# Patient Record
Sex: Female | Born: 1995 | Hispanic: Yes | Marital: Single | Smoking: Never smoker
Health system: Southern US, Community
[De-identification: ages and names within clinical notes are randomized; demographics above are authoritative.]

## PROBLEM LIST (undated history)

## (undated) ENCOUNTER — Inpatient Hospital Stay: Payer: Self-pay

## (undated) DIAGNOSIS — Z789 Other specified health status: Secondary | ICD-10-CM

## (undated) HISTORY — PX: NO PAST SURGERIES: SHX2092

---
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
## 2016-01-02 ENCOUNTER — Encounter: Payer: Self-pay | Admitting: *Deleted

## 2016-01-02 ENCOUNTER — Observation Stay
Admission: EM | Admit: 2016-01-02 | Discharge: 2016-01-02 | Disposition: A | Discharge status: Home / Self Care | Payer: No Typology Code available for payment source | Attending: Obstetrics and Gynecology | Admitting: Obstetrics and Gynecology

## 2016-01-02 ENCOUNTER — Emergency Department
Admission: EM | Admit: 2016-01-02 | Discharge: 2016-01-02 | Disposition: A | Discharge status: Home / Self Care | Payer: No Typology Code available for payment source | Attending: Obstetrics and Gynecology | Admitting: Obstetrics and Gynecology

## 2016-01-02 DIAGNOSIS — S3991XA Unspecified injury of abdomen, initial encounter: Secondary | ICD-10-CM | POA: Insufficient documentation

## 2016-01-02 DIAGNOSIS — Y9389 Activity, other specified: Secondary | ICD-10-CM | POA: Insufficient documentation

## 2016-01-02 DIAGNOSIS — O9A213 Injury, poisoning and certain other consequences of external causes complicating pregnancy, third trimester: Secondary | ICD-10-CM | POA: Insufficient documentation

## 2016-01-02 DIAGNOSIS — Y998 Other external cause status: Secondary | ICD-10-CM | POA: Insufficient documentation

## 2016-01-02 DIAGNOSIS — Z3A37 37 weeks gestation of pregnancy: Secondary | ICD-10-CM | POA: Insufficient documentation

## 2016-01-02 DIAGNOSIS — Y92410 Unspecified street and highway as the place of occurrence of the external cause: Secondary | ICD-10-CM | POA: Insufficient documentation
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
## 2016-01-02 NOTE — OB Triage Note (Signed)
FHT reactive, reassuring, occasional mild ctx and irritability noted, pt denies any abd or back pain. Confirms she feels fine to go home.
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
## 2016-01-04 LAB — OB RESULTS CONSOLE GBS: STREP GROUP B AG: POSITIVE

## 2016-01-17 ENCOUNTER — Encounter: Payer: Self-pay | Admitting: *Deleted

## 2016-01-17 ENCOUNTER — Observation Stay
Admission: EM | Admit: 2016-01-17 | Discharge: 2016-01-17 | Disposition: A | Discharge status: Home / Self Care | Payer: Medicaid Other | Attending: Obstetrics and Gynecology | Admitting: Obstetrics and Gynecology

## 2016-01-17 ENCOUNTER — Inpatient Hospital Stay
Admission: EM | Admit: 2016-01-17 | Discharge: 2016-01-17 | Disposition: A | Discharge status: Home / Self Care | Payer: Medicaid Other | Source: Home / Self Care | Admitting: Obstetrics and Gynecology

## 2016-01-17 DIAGNOSIS — O471 False labor at or after 37 completed weeks of gestation: Principal | ICD-10-CM | POA: Insufficient documentation

## 2016-01-17 DIAGNOSIS — Z3A40 40 weeks gestation of pregnancy: Secondary | ICD-10-CM

## 2016-01-17 DIAGNOSIS — Z3A39 39 weeks gestation of pregnancy: Secondary | ICD-10-CM | POA: Diagnosis not present

## 2016-01-17 DIAGNOSIS — O479 False labor, unspecified: Secondary | ICD-10-CM | POA: Diagnosis present

## 2016-01-17 HISTORY — DX: Other specified health status: Z78.9

## 2016-01-17 MED ORDER — ZOLPIDEM TARTRATE 5 MG PO TABS
5.0000 mg | ORAL_TABLET | Freq: Once | ORAL | Status: AC
  Administered 2016-01-17: 5 mg via ORAL
  Filled 2016-01-17: qty 1

## 2016-01-17 MED ORDER — ACETAMINOPHEN 325 MG PO TABS: 650.0000 mg | ORAL_TABLET | ORAL | Status: DC | PRN

## 2016-01-17 MED ORDER — CALCIUM CARBONATE ANTACID 500 MG PO CHEW: 2.0000 | CHEWABLE_TABLET | ORAL | Status: DC | PRN

## 2016-01-17 NOTE — OB Triage Note (Signed)
Patient c/o contractions q5-7 minutes since 1400. Rates pain 7/10.

## 2016-01-17 NOTE — Progress Notes (Signed)
Dr Dalbert GarnetBeasley given update- aware pt resting/ sleeping at times. Aware pt reports abd tightenings assoc with some lower backache at times. No further Af viewed on perineum, bed pads or on glove- nitazine neg.  Dr Dalbert GarnetBeasley informed of several absent prenatal lab values, present ones- GBS (positive), CBC, GC/C, glucose- only ones available from [facility]. Dr Dalbert GarnetBeasley instructing pt to keep her regular appointment this week with clinic, and inquire where her prenatal panel labs would be found or [facility]. Also requesting pt to return to clinic or [facility] if further vaginal leakage occurs.

## 2016-01-17 NOTE — OB Triage Note (Signed)
Patient discharged home ambulatory and in stable condition.

## 2016-01-17 NOTE — OB Triage Provider Note (Signed)
TRIAGE VISIT with NST   Kaitlin Marquez is a 20 y.o. G1P0. She is at 56110w5d gestation, presenting with signs of labor and ROM.  Indication: Contractions  S: Resting comfortably. Mild-palpating CTX, no VB. Active fetal movement. Concerned about leaking clear fluid, though did not need pad and perineum was dry on presentation. O:  BP 105/53 mmHg  Pulse 82  Temp(Src) 97.9 F (36.6 C) (Oral)  Resp 18  Ht 5' (1.524 m)  Wt 67.132 kg (148 lb)  BMI 28.90 kg/m2  LMP 04/24/2015 No results found for this or any previous visit (from the past 48 hour(s)).  Abd: FNTTP      Ext: Non-tender, Nonedmeatous    FHT: Reactive strip with mod var, +accels no decels TOCO: quiet [id number]:Dilation: Fingertip Effacement (%): Thick Cervical Position: Posterior Station: Ballotable Presentation: Vertex Exam by:: Kaitlin FishermanM Taylor RN  NST: Reactive. See FHT above for particulars.  A/P:  20 y.o. G1P0 54110w5d with contractions and leaking fluid.   Labor: not present.   R/o ROM: SSE negative x 3. No fluid noted after two hours of monitoring and cervix remained closed.  Fetal Wellbeing: Reassuring Cat 1 tracing.  D/c home stable, precautions reviewed, follow-up as scheduled.

## 2016-01-17 NOTE — Progress Notes (Signed)
Dr Dalbert GarnetBeasley called [facility], requested pt to be observed for 2 hours after v/e and re examined.

## 2016-01-17 NOTE — Progress Notes (Signed)
Dr Dalbert GarnetBeasley given report, states since nitrazine neg and no AF viewed or reported since pt arrival, RN to do v/e. Pt may be d/c'ed home if cervix not demonstrating changes associated with  labor and pt comfortable with no further leakage of fluid.

## 2016-01-17 NOTE — OB Triage Note (Signed)
Pt reports awaking at midnight to find her pj groin wet with clear fluid. Has continued to leak small amounts clear fluid. Reports constant lower back pain - starting approx 20 min after she awoke

## 2016-01-17 NOTE — Progress Notes (Signed)
Pt left walking with belongings and fob. Received d/c and Dr Quincy SheehanBeasely's  Instructions, reviewed with RN. Pt voicing understanding.

## 2016-01-17 NOTE — OB Triage Note (Signed)
See Progress note

## 2016-01-18 ENCOUNTER — Inpatient Hospital Stay
Admission: EM | Admit: 2016-01-18 | Discharge: 2016-01-21 | DRG: 775 | Disposition: A | Discharge status: Home / Self Care | Payer: Medicaid Other | Attending: Obstetrics and Gynecology | Admitting: Obstetrics and Gynecology

## 2016-01-18 ENCOUNTER — Inpatient Hospital Stay
Admission: EM | Admit: 2016-01-18 | Discharge: 2016-01-18 | Disposition: A | Discharge status: Home / Self Care | Payer: Medicaid Other | Attending: Obstetrics and Gynecology | Admitting: Obstetrics and Gynecology

## 2016-01-18 ENCOUNTER — Encounter: Payer: Self-pay | Admitting: *Deleted

## 2016-01-18 DIAGNOSIS — Z3A38 38 weeks gestation of pregnancy: Secondary | ICD-10-CM

## 2016-01-18 DIAGNOSIS — Z3A40 40 weeks gestation of pregnancy: Secondary | ICD-10-CM | POA: Insufficient documentation

## 2016-01-18 DIAGNOSIS — O99824 Streptococcus B carrier state complicating childbirth: Secondary | ICD-10-CM | POA: Diagnosis present

## 2016-01-18 DIAGNOSIS — O99820 Streptococcus B carrier state complicating pregnancy: Secondary | ICD-10-CM | POA: Diagnosis present

## 2016-01-18 LAB — CHLAMYDIA/NGC RT PCR (ARMC ONLY)
CHLAMYDIA TR: NOT DETECTED
N GONORRHOEAE: NOT DETECTED

## 2016-01-18 LAB — CBC
HEMATOCRIT: 35.8 % (ref 35.0–47.0)
Hemoglobin: 12 g/dL (ref 12.0–16.0)
MCH: 29 pg (ref 26.0–34.0)
MCHC: 33.6 g/dL (ref 32.0–36.0)
MCV: 86.3 fL (ref 80.0–100.0)
Platelets: 230 10*3/uL (ref 150–440)
RBC: 4.15 MIL/uL (ref 3.80–5.20)
RDW: 16.3 % — AB (ref 11.5–14.5)
WBC: 9.5 10*3/uL (ref 3.6–11.0)

## 2016-01-18 LAB — TYPE AND SCREEN
ABO/RH(D): O POS
ANTIBODY SCREEN: NEGATIVE

## 2016-01-18 MED ORDER — ZOLPIDEM TARTRATE 5 MG PO TABS: 5.0000 mg | ORAL_TABLET | Freq: Every evening | ORAL | Status: DC | PRN

## 2016-01-18 MED ORDER — BUTORPHANOL TARTRATE 1 MG/ML IJ SOLN: 2.0000 mg | INTRAMUSCULAR | Status: DC | PRN

## 2016-01-18 MED ORDER — LIDOCAINE HCL (PF) 1 % IJ SOLN
INTRAMUSCULAR | Status: AC
  Filled 2016-01-18: qty 30

## 2016-01-18 MED ORDER — OXYTOCIN 10 UNIT/ML IJ SOLN
INTRAMUSCULAR | Status: AC
  Filled 2016-01-18: qty 2

## 2016-01-18 MED ORDER — SODIUM CHLORIDE 0.9 % IV SOLN: 2.0000 g | Freq: Once | INTRAVENOUS | Status: DC

## 2016-01-18 MED ORDER — LACTATED RINGERS IV SOLN
INTRAVENOUS | Status: DC
  Administered 2016-01-18 – 2016-01-19 (×2): via INTRAVENOUS

## 2016-01-18 MED ORDER — OXYTOCIN 40 UNITS IN LACTATED RINGERS INFUSION - SIMPLE MED
2.5000 [IU]/h | INTRAVENOUS | Status: DC
  Filled 2016-01-18: qty 1000

## 2016-01-18 MED ORDER — SODIUM CHLORIDE 0.9 % IV SOLN
2.0000 g | Freq: Once | INTRAVENOUS | Status: AC
  Administered 2016-01-18: 2 g via INTRAVENOUS

## 2016-01-18 MED ORDER — ACETAMINOPHEN 325 MG PO TABS: 650.0000 mg | ORAL_TABLET | ORAL | Status: DC | PRN

## 2016-01-18 MED ORDER — TERBUTALINE SULFATE 1 MG/ML IJ SOLN: 0.2500 mg | Freq: Once | INTRAMUSCULAR | Status: DC | PRN

## 2016-01-18 MED ORDER — AMMONIA AROMATIC IN INHA
RESPIRATORY_TRACT | Status: DC
Start: 2016-01-18 — End: 2016-01-19
  Filled 2016-01-18: qty 10

## 2016-01-18 MED ORDER — CITRIC ACID-SODIUM CITRATE 334-500 MG/5ML PO SOLN
30.0000 mL | ORAL | Status: DC | PRN
Start: 2016-01-18 — End: 2016-01-19

## 2016-01-18 MED ORDER — AMPICILLIN SODIUM 1 G IJ SOLR: 1.0000 g | Freq: Four times a day (QID) | INTRAMUSCULAR | Status: DC

## 2016-01-18 MED ORDER — PRENATAL MULTIVITAMIN CH
1.0000 | ORAL_TABLET | Freq: Every day | ORAL | Status: DC
  Administered 2016-01-19: 1 via ORAL
  Filled 2016-01-18 (×3): qty 1

## 2016-01-18 MED ORDER — AMPICILLIN SODIUM 1 G IJ SOLR
1.0000 g | Freq: Four times a day (QID) | INTRAMUSCULAR | Status: DC
  Administered 2016-01-18: 1 g via INTRAVENOUS
  Filled 2016-01-18 (×4): qty 1000

## 2016-01-18 MED ORDER — ONDANSETRON HCL 4 MG/2ML IJ SOLN: 4.0000 mg | Freq: Four times a day (QID) | INTRAMUSCULAR | Status: DC | PRN

## 2016-01-18 MED ORDER — LACTATED RINGERS IV SOLN: 500.0000 mL | INTRAVENOUS | Status: DC | PRN

## 2016-01-18 MED ORDER — SODIUM CHLORIDE 0.9 % IV SOLN
INTRAVENOUS | Status: AC
  Filled 2016-01-18: qty 2000

## 2016-01-18 MED ORDER — CALCIUM CARBONATE ANTACID 500 MG PO CHEW: 2.0000 | CHEWABLE_TABLET | ORAL | Status: DC | PRN

## 2016-01-18 MED ORDER — OXYTOCIN BOLUS FROM INFUSION: 500.0000 mL | INTRAVENOUS | Status: DC

## 2016-01-18 MED ORDER — DOCUSATE SODIUM 100 MG PO CAPS
100.0000 mg | ORAL_CAPSULE | Freq: Every day | ORAL | Status: DC
  Administered 2016-01-19: 100 mg via ORAL
  Filled 2016-01-18: qty 1

## 2016-01-18 MED ORDER — OXYTOCIN 40 UNITS IN LACTATED RINGERS INFUSION - SIMPLE MED
1.0000 m[IU]/min | INTRAVENOUS | Status: DC
Start: 2016-01-18 — End: 2016-01-19
  Administered 2016-01-18: 1 m[IU]/min via INTRAVENOUS

## 2016-01-18 MED ORDER — MISOPROSTOL 200 MCG PO TABS
ORAL_TABLET | ORAL | Status: AC
  Filled 2016-01-18: qty 4

## 2016-01-18 NOTE — Progress Notes (Signed)
Patient ID: [name], female   DOB: Sep 01, 1996, 20 y.o.   MRN: [id number] Ctx q 4-5 minutes . Reassuring fetal monitoring . Will start Pitocin

## 2016-01-18 NOTE — Progress Notes (Signed)
Dr schermerhorn notified of cervical dilation.

## 2016-01-18 NOTE — Progress Notes (Signed)
Kaitlin HsuLizbeth Marquez is a 20 y.o. G1P0 at 9446w3d  Subjective: Ctx , no pain meds  Second dose of ABX completed   Objective: BP 116/51 mmHg  Pulse 113  Temp(Src) 98.3 F (36.8 C)      FHT:  FHR: 120 bpm, variability: moderate,  accelerations:  Present,  decelerations:  Absent UC:   irregular, every 3-5 minutes SVE:   Dilation: 6 Effacement (%): 100 Station: -1 Exam by:: dr Aziyah Provencal   AROM : clear fluid  Labs: Lab Results  Component Value Date   WBC 9.5 01/18/2016   HGB 12.0 01/18/2016   HCT 35.8 01/18/2016   MCV 86.3 01/18/2016   PLT 230 01/18/2016    Assessment / Plan: Spontaneous labor, progressing normally  Labor: Progressing normally Preeclampsia:  n/a Fetal Wellbeing:  Category I Pain Control:  Labor support without medications I/D:  n/a Anticipated MOD:  NSVD  Will add pitocin after 1 hour if not in adequate ctx pattern   [name] 01/18/2016, 8:43 PM

## 2016-01-18 NOTE — H&P (Signed)
Kaitlin HsuLizbeth Marquez is a 20 y.o. female 2 38+3 WEEKS presenting for LABOR . History OB History    Gravida Para Term Preterm AB TAB SAB Ectopic Multiple Living   1              No past medical history on file.anemia  No past surgical history on file. Family History: family history is not on file. Social History:  has no tobacco, alcohol, and drug history on file.   Prenatal Transfer Tool  Maternal Diabetes: No Genetic Screening: Normal Maternal Ultrasounds/Referrals: Normal Fetal Ultrasounds or other Referrals:  None Maternal Substance Abuse:  No Significant Maternal Medications:  None Significant Maternal Lab Results:  None, Other Comments:  None  ROS  Dilation: 5.5 Effacement (%): 70 Station: -2 Exam by:: AED There were no vitals taken for this visit. Exam Physical Exam   By tjs  Lungs CTA  CV RRR  cx : 5 cm / c/ 0 vix , BBOW  Prenatal labs: ABO, Rh: --/--/O POS (03/21 1544) Antibody: NEG (03/21 1544) Rubella:   immune RPR:   neg HBsAg:   neg HIV:   neg  GBS: Positive (03/07 0000)   Assessment/Plan: Labor  Reassuring fetal  Monitoring ( cat 1) GBS  Has one dose of ampicillin started at 1615   SCHERMERHORN,THOMAS 01/18/2016, 5:36 PM

## 2016-01-19 LAB — CBC
HCT: 31.7 % — ABNORMAL LOW (ref 35.0–47.0)
Hemoglobin: 10.6 g/dL — ABNORMAL LOW (ref 12.0–16.0)
MCH: 28.8 pg (ref 26.0–34.0)
MCHC: 33.3 g/dL (ref 32.0–36.0)
MCV: 86.3 fL (ref 80.0–100.0)
PLATELETS: 212 10*3/uL (ref 150–440)
RBC: 3.68 MIL/uL — ABNORMAL LOW (ref 3.80–5.20)
RDW: 16.1 % — AB (ref 11.5–14.5)
WBC: 14 10*3/uL — AB (ref 3.6–11.0)

## 2016-01-19 LAB — ABO/RH: ABO/RH(D): O POS

## 2016-01-19 LAB — RPR: RPR: NONREACTIVE

## 2016-01-19 MED ORDER — LANOLIN HYDROUS EX OINT: TOPICAL_OINTMENT | CUTANEOUS | Status: DC | PRN

## 2016-01-19 MED ORDER — ONDANSETRON HCL 4 MG/2ML IJ SOLN: 4.0000 mg | INTRAMUSCULAR | Status: DC | PRN

## 2016-01-19 MED ORDER — SENNOSIDES-DOCUSATE SODIUM 8.6-50 MG PO TABS
2.0000 | ORAL_TABLET | ORAL | Status: DC
  Administered 2016-01-20: 2 via ORAL
  Filled 2016-01-19: qty 2

## 2016-01-19 MED ORDER — DIPHENHYDRAMINE HCL 25 MG PO CAPS
25.0000 mg | ORAL_CAPSULE | Freq: Four times a day (QID) | ORAL | Status: DC | PRN
  Filled 2016-01-19: qty 1

## 2016-01-19 MED ORDER — ACETAMINOPHEN 325 MG PO TABS: 650.0000 mg | ORAL_TABLET | ORAL | Status: DC | PRN

## 2016-01-19 MED ORDER — MEASLES, MUMPS & RUBELLA VAC ~~LOC~~ INJ: 0.5000 mL | INJECTION | Freq: Once | SUBCUTANEOUS | Status: DC

## 2016-01-19 MED ORDER — FERROUS SULFATE 325 (65 FE) MG PO TABS
325.0000 mg | ORAL_TABLET | Freq: Two times a day (BID) | ORAL | Status: DC
  Administered 2016-01-19 – 2016-01-21 (×5): 325 mg via ORAL
  Filled 2016-01-19 (×5): qty 1

## 2016-01-19 MED ORDER — SIMETHICONE 80 MG PO CHEW: 80.0000 mg | CHEWABLE_TABLET | ORAL | Status: DC | PRN

## 2016-01-19 MED ORDER — ONDANSETRON HCL 4 MG PO TABS: 4.0000 mg | ORAL_TABLET | ORAL | Status: DC | PRN

## 2016-01-19 MED ORDER — MAGNESIUM HYDROXIDE 400 MG/5ML PO SUSP: 30.0000 mL | ORAL | Status: DC | PRN

## 2016-01-19 MED ORDER — DIBUCAINE 1 % RE OINT: 1.0000 "application " | TOPICAL_OINTMENT | RECTAL | Status: DC | PRN

## 2016-01-19 MED ORDER — WITCH HAZEL-GLYCERIN EX PADS: 1.0000 "application " | MEDICATED_PAD | CUTANEOUS | Status: DC | PRN

## 2016-01-19 MED ORDER — IBUPROFEN 600 MG PO TABS
600.0000 mg | ORAL_TABLET | Freq: Four times a day (QID) | ORAL | Status: DC
  Administered 2016-01-19 – 2016-01-21 (×10): 600 mg via ORAL
  Filled 2016-01-19 (×10): qty 1

## 2016-01-19 MED ORDER — BENZOCAINE-MENTHOL 20-0.5 % EX AERO
1.0000 "application " | INHALATION_SPRAY | CUTANEOUS | Status: DC | PRN
  Administered 2016-01-19: 1 via TOPICAL
  Filled 2016-01-19: qty 56

## 2016-01-19 NOTE — Progress Notes (Signed)
Final Diagnosis: IUP at 40 weeks with labor symptoms. Pt triaged and discharged after NST reactive.

## 2016-01-19 NOTE — Progress Notes (Signed)
Report received from [place]. Pt laboring, Pitocin at 1 milliunit, last cervical exam, 8cm, pt refusing IV pain med or Epidural. In room to assume care, pt breathing well with contraction and tolerating labor well, family present in room very supportive. Introduced self to pt and family, reviewed plan and explained s/s that pt should report if noted. Understanding verbalized.

## 2016-01-20 NOTE — Progress Notes (Signed)
PPD #1, SVD, baby girl "[place]"  S:  Reports feeling good             Tolerating po/ No nausea or vomiting             Bleeding is light             Pain controlled withMotrin             Up ad lib / ambulatory / voiding QS  Newborn breast feeding - going well    O:               VS: BP 104/53 mmHg  Pulse 63  Temp(Src) 98.5 F (36.9 C) (Oral)  Resp 16  Ht 5' (1.524 m)  Wt 66.225 kg (146 lb)  BMI 28.51 kg/m2  SpO2 100%   LABS:              Recent Labs  01/18/16 1544 01/19/16 0639  WBC 9.5 14.0*  HGB 12.0 10.6*  PLT 230 212               Blood type: --/--/O POS (03/21 1545)  Rubella:                       I&O: Intake/Output      03/22 0701 - 03/23 0700 03/23 0701 - 03/24 0700   I.V. (mL/kg) 800 (12.1)    Total Intake(mL/kg) 800 (12.1)    Urine (mL/kg/hr) 900 (0.6)    Blood     Total Output 900     Net -100                        Physical Exam:             Alert and oriented X3  Lungs: Clear and unlabored  Heart: regular rate and rhythm / no mumurs  Abdomen: soft, non-tender, non-distended              Fundus: firm, non-tender, U-1  Perineum: slight edema, median episiotomy healing well, no significant erythema   Lochia: light, no clots  Extremities: no edema, no calf pain or tenderness    A: PPD # 1, SVD   Doing well - stable status  ABL Anemia - on Ferrous Sulfate  GBS positive - without adequate abx treatment - baby to be discharged home tomorrow  P: Routine post partum orders  See Lactation today   May shower, ambulate in halls  Contraception: planning Depo shot    Anticipate d/c home tomorrow   Carlean JewsMeredith Deriana Vanderhoef, CNM

## 2016-01-21 MED ORDER — WITCH HAZEL-GLYCERIN EX PADS: 1.0000 "application " | MEDICATED_PAD | CUTANEOUS | Status: DC | PRN

## 2016-01-21 MED ORDER — IBUPROFEN 600 MG PO TABS: 600.0000 mg | ORAL_TABLET | Freq: Four times a day (QID) | ORAL | Status: DC

## 2016-01-21 NOTE — Progress Notes (Signed)
Discharge instructions complete and prescriptions given. Patient verbalizes understanding of teaching. Patient discharged home at 1645.

## 2016-01-21 NOTE — Discharge Summary (Signed)
  Obstetric Discharge Summary   Patient ID: Kaitlin Marquez MRN: [id number] DOB/AGE: 17-Jun-1996 20 y.o.   Date of Admission: 01/18/2016  Date of Discharge:  01/21/16 Admitting Diagnosis: Onset of Labor at 966w4d  Secondary Diagnosis: GBS Positive  Mode of Delivery: normal spontaneous vaginal delivery     Discharge Diagnosis: Postpartum care following vaginal delivery   Intrapartum Procedures: Atificial rupture of membranes, episiotomy Median, GBS prophylaxis and pitocin augmentation   Post partum procedures: none  Complications: second degree ( MLE) degree perineal laceration   Brief Hospital Course  Kaitlin Marquez is a G1P0 who had a SVD on 01/19/16;  for further details of this delivery, please refer to the delivery note.  Patient had an uncomplicated postpartum course.  By time of discharge on PPD#2, her pain was controlled on oral pain medications; she had appropriate lochia and was ambulating, voiding without difficulty and tolerating regular diet.  She was deemed stable for discharge to home.    Labs: CBC Latest Ref Rng 01/19/2016 01/18/2016  WBC 3.6 - 11.0 K/uL 14.0(H) 9.5  Hemoglobin 12.0 - 16.0 g/dL 10.6(L) 12.0  Hematocrit 35.0 - 47.0 % 31.7(L) 35.8  Platelets 150 - 440 K/uL 212 230   O POS  Physical exam:  Blood pressure 103/52, pulse 55, temperature 98.5 F (36.9 C), temperature source Oral, resp. rate 18, height 5' (1.524 m), weight 66.225 kg (146 lb), SpO2 100 %, unknown if currently breastfeeding. General: alert and no distress Lochia: appropriate Abdomen: soft, NT Uterine Fundus: firm MLE not checked Extremities: No evidence of DVT seen on physical exam. No lower extremity edema.  Discharge Instructions: Per After Visit Summary. Activity: Advance as tolerated. Pelvic rest for 6 weeks.  Also refer to After Visit Summary Diet: Regular Medications:   Medication List    Notice    You have not been prescribed any medications.      Outpatient follow up:  Postpartum contraception: Depo-Provera  Discharged Condition: good  Discharged to: home   Newborn Data:  Baby girl  named "Isabella"  Disposition:home with mother  Apgars: APGAR (1 MIN): 7   APGAR (5 MINS): 9   APGAR (10 MINS):    Baby Feeding: Breast  Karena AddisonSigmon, Meredith C, CNM 01/21/2016

## 2016-04-26 ENCOUNTER — Encounter (HOSPITAL_COMMUNITY): Payer: Self-pay

## 2016-11-06 ENCOUNTER — Encounter (HOSPITAL_COMMUNITY): Payer: Self-pay

## 2020-01-05 ENCOUNTER — Ambulatory Visit: Payer: MEDICAID | Attending: Internal Medicine

## 2020-01-05 DIAGNOSIS — Z23 Encounter for immunization: Secondary | ICD-10-CM | POA: Insufficient documentation

## 2020-01-05 NOTE — Progress Notes (Signed)
   Covid-19 Vaccination Clinic  Name:  Hang Ammon    MRN: [id number] DOB: [birth year]  01/05/2020  Ms. Haileigh Pitz was observed post Covid-19 immunization for 15 minutes without incident. She was provided with Vaccine Information Sheet and instruction to access the V-Safe system.   Ms. Dyan Labarbera was instructed to call 911 with any severe reactions post vaccine: Marland Kitchen Difficulty breathing  . Swelling of face and throat  . A fast heartbeat  . A bad rash all over body  . Dizziness and weakness   Immunizations Administered    Name Date Dose VIS Date Route   Pfizer COVID-19 Vaccine 01/05/2020  8:21 AM 0.3 mL 10/10/2019 Intramuscular   Manufacturer: [employer], [employer]   Lot: [id number]   NDC: [address]-2

## 2020-01-27 ENCOUNTER — Ambulatory Visit: Payer: Self-pay

## 2020-02-27 ENCOUNTER — Ambulatory Visit: Payer: MEDICAID | Attending: Internal Medicine

## 2020-02-27 DIAGNOSIS — Z23 Encounter for immunization: Secondary | ICD-10-CM

## 2020-02-27 NOTE — Progress Notes (Signed)
   Covid-19 Vaccination Clinic  Name:  Kaitlin Marquez    MRN: [id number] DOB: 1996-09-06  02/27/2020  Ms. Kaitlin Marquez was observed post Covid-19 immunization for 15 minutes without incident. She was provided with Vaccine Information Sheet and instruction to access the V-Safe system.   Ms. Kaitlin Marquez was instructed to call 911 with any severe reactions post vaccine: Marland Kitchen Difficulty breathing  . Swelling of face and throat  . A fast heartbeat  . A bad rash all over body  . Dizziness and weakness   Immunizations Administered    Name Date Dose VIS Date Route   Pfizer COVID-19 Vaccine 02/27/2020 10:41 AM 0.3 mL 12/24/2018 Intramuscular   Manufacturer: [employer], [employer]   Lot: [id number]   NDC: [address]-7

## 2020-05-03 ENCOUNTER — Ambulatory Visit (INDEPENDENT_AMBULATORY_CARE_PROVIDER_SITE_OTHER)
Admission: RE | Admit: 2020-05-03 | Discharge: 2020-05-03 | Disposition: A | Discharge status: Home / Self Care | Payer: Self-pay | Source: Ambulatory Visit

## 2020-05-03 DIAGNOSIS — K529 Noninfective gastroenteritis and colitis, unspecified: Secondary | ICD-10-CM

## 2020-05-03 DIAGNOSIS — R1010 Upper abdominal pain, unspecified: Secondary | ICD-10-CM

## 2020-05-03 MED ORDER — ONDANSETRON HCL 4 MG PO TABS: 4.0000 mg | ORAL_TABLET | Freq: Four times a day (QID) | ORAL | 0 refills | Status: DC

## 2020-05-03 NOTE — Discharge Instructions (Signed)
Take the antinausea medication as directed.    Keep yourself hydrated with clear liquids, such as water, Gatorade, Pedialyte, Sprite, or ginger ale.    Go to the emergency department if you have worsening symptoms.

## 2020-05-03 NOTE — ED Provider Notes (Signed)
Virtual Visit via Video Note:  Kaitlin Marquez  initiated request for Telemedicine visit with [facility] team. I connected with Kaitlin Marquez  on 05/03/2020 at 10:36 AM  for a synchronized telemedicine visit using a video enabled HIPPA compliant telemedicine application. I verified that I am speaking with Kaitlin Marquez  using two identifiers. Kaitlin Bail, NP  was physically located in a [facility] Urgent care site and Kaitlin Marquez was located at a different location.   The limitations of evaluation and management by telemedicine as well as the availability of in-person appointments were discussed. Patient was informed that she  may incur a bill ( including co-pay) for this virtual visit encounter. Kaitlin Marquez  expressed understanding and gave verbal consent to proceed with virtual visit.     History of Present Illness:Kaitlin Marquez  is a 24 y.o. female presents for evaluation of vomiting x 3 episodes since last night.  She also reports upper abdominal pain and diarrhea today.  She denies fever, chills, cough, SOB, rash, or other symptoms.  Last BM 30 min PTA.  Treatment at home with Tylenol.  She denies current pregnancy or breastfeeding.      No Known Allergies   Past Medical History:  Diagnosis Date  . Medical history non-contributory      Social History   Tobacco Use  . Smoking status: Never Smoker  Substance Use Topics  . Alcohol use: No  . Drug use: No   ROS: as stated in HPI.  All other systems reviewed and negative.     Observations/Objective: Physical Exam  VITALS: Patient denies fever. GENERAL: Alert, appears well and in no acute distress. HEENT: Atraumatic. Oral mucosa appears moist. NECK: Normal movements of the head and neck. CARDIOPULMONARY: No increased WOB. Speaking in clear sentences. I:E ratio WNL.  MS: Moves all visible extremities without noticeable abnormality. PSYCH: Pleasant and  cooperative, well-groomed. Speech normal rate and rhythm. Affect is appropriate. Insight and judgement are appropriate. Attention is focused, linear, and appropriate.  NEURO: CN grossly intact. Oriented as arrived to appointment on time with no prompting. Moves both UE equally.  SKIN: No obvious lesions, wounds, erythema, or cyanosis noted on face or hands.   Assessment and Plan:    ICD-10-CM   1. Gastroenteritis  K52.9   2. Pain of upper abdomen  R10.10        Follow Up Instructions: Treating with Zofran as needed for nausea and vomiting.  Work note provided for today.  Instructed patient to come to the urgent care or go to the ED if she has worsening symptoms.  Patient agrees to plan of care.      I discussed the assessment and treatment plan with the patient. The patient was provided an opportunity to ask questions and all were answered. The patient agreed with the plan and demonstrated an understanding of the instructions.   The patient was advised to call back or seek an in-person evaluation if the symptoms worsen or if the condition fails to improve as anticipated.      Kaitlin Bail, NP  05/03/2020 10:36 AM         Kaitlin Bail, NP 05/03/20 1036

## 2022-08-03 ENCOUNTER — Other Ambulatory Visit: Payer: Self-pay | Admitting: Internal Medicine

## 2022-08-03 DIAGNOSIS — R1084 Generalized abdominal pain: Secondary | ICD-10-CM

## 2022-09-07 ENCOUNTER — Other Ambulatory Visit: Payer: Self-pay

## 2023-09-24 LAB — OB RESULTS CONSOLE VARICELLA ZOSTER ANTIBODY, IGG: Varicella: NON-IMMUNE/NOT IMMUNE

## 2023-09-24 LAB — OB RESULTS CONSOLE HEPATITIS B SURFACE ANTIGEN: Hepatitis B Surface Ag: NEGATIVE

## 2023-09-24 LAB — OB RESULTS CONSOLE RPR: RPR: NONREACTIVE

## 2023-10-31 NOTE — L&D Delivery Note (Signed)
 Delivery Note  Date of delivery: 05/08/2024 Estimated Date of Delivery: 05/08/24 Patient's last menstrual period was 08/02/2023. EGA: [redacted]w[redacted]d  Delivery Note At 5:16 AM a viable female was delivered via Vaginal, Spontaneous (Presentation: Left Occiput Anterior).  APGAR: pending; weight pending.  Placenta status: Spontaneous, Intact.  Cord: 3 vessels with the following complications: None.  Cord pH: n/a  First Stage: Labor onset: unknown Augmentation : AROM Analgesia /Anesthesia intrapartum: none AROM at 0424  Kaitlin Marquez presented to L&D with active labor. She was augmented with AROM. Fentanyl  given for pain relief.   Second Stage: Complete dilation at 0502 Onset of pushing at 0510 FHR second stage Cat I Delivery at 0516 on 05/08/2024  She progressed to complete and had a spontaneous vaginal birth of a live female over an intact perineum. The fetal head was delivered in OA position with restitution to LOA. Tight double nuchal cord resolved with summersault maneuver. Anterior then posterior shoulders delivered spontaneously with minimal assistance. Baby placed on mom's abdomen and attended to by transition RN, however the cord clamped and cut immediately by FOB to take the baby to the warmer for resuscitation and suction. Cord blood obtained for newborn labs.  Third Stage: Placenta delivered intact with 3VC at 0520 Placenta disposition: routine disposal  Uterine tone firm / bleeding min IV pitocin  given for hemorrhage prophylaxis  Anesthesia: None Episiotomy: None Lacerations: None Suture Repair: n/a Est. Blood Loss (mL):  100  Complications: none  Mom to postpartum.  Baby to Couplet care / Skin to Skin.  Newborn: Birth Weight: pending  Apgar Scores: pending Feeding planned: Kaitlin Marquez, CNM 05/08/2024 5:34 AM

## 2024-03-03 LAB — OB RESULTS CONSOLE HIV ANTIBODY (ROUTINE TESTING): HIV: NONREACTIVE

## 2024-03-03 LAB — OB RESULTS CONSOLE GBS: GBS: NEGATIVE

## 2024-04-10 LAB — OB RESULTS CONSOLE GC/CHLAMYDIA
Chlamydia: NEGATIVE
Neisseria Gonorrhea: NEGATIVE

## 2024-04-10 LAB — OB RESULTS CONSOLE RUBELLA ANTIBODY, IGM: Rubella: IMMUNE

## 2024-05-07 ENCOUNTER — Encounter: Payer: Self-pay | Admitting: Obstetrics and Gynecology

## 2024-05-07 ENCOUNTER — Observation Stay
Admission: EM | Admit: 2024-05-07 | Discharge: 2024-05-07 | Disposition: A | Discharge status: Home / Self Care | Payer: MEDICAID | Attending: Obstetrics and Gynecology | Admitting: Obstetrics and Gynecology

## 2024-05-07 ENCOUNTER — Inpatient Hospital Stay
Admission: EM | Admit: 2024-05-07 | Discharge: 2024-05-09 | DRG: 807 | Disposition: A | Discharge status: Home / Self Care | Payer: MEDICAID | Attending: Obstetrics and Gynecology | Admitting: Obstetrics and Gynecology

## 2024-05-07 ENCOUNTER — Other Ambulatory Visit: Payer: Self-pay

## 2024-05-07 DIAGNOSIS — O471 False labor at or after 37 completed weeks of gestation: Principal | ICD-10-CM | POA: Insufficient documentation

## 2024-05-07 DIAGNOSIS — D649 Anemia, unspecified: Secondary | ICD-10-CM | POA: Insufficient documentation

## 2024-05-07 DIAGNOSIS — O99013 Anemia complicating pregnancy, third trimester: Secondary | ICD-10-CM | POA: Insufficient documentation

## 2024-05-07 DIAGNOSIS — Z3A39 39 weeks gestation of pregnancy: Secondary | ICD-10-CM | POA: Diagnosis not present

## 2024-05-07 DIAGNOSIS — O99824 Streptococcus B carrier state complicating childbirth: Principal | ICD-10-CM | POA: Diagnosis present

## 2024-05-07 DIAGNOSIS — O9902 Anemia complicating childbirth: Secondary | ICD-10-CM | POA: Diagnosis present

## 2024-05-07 DIAGNOSIS — O479 False labor, unspecified: Principal | ICD-10-CM | POA: Diagnosis present

## 2024-05-07 MED ORDER — LACTATED RINGERS IV SOLN: INTRAVENOUS | Status: DC

## 2024-05-07 MED ORDER — SOD CITRATE-CITRIC ACID 500-334 MG/5ML PO SOLN: 30.0000 mL | ORAL | Status: DC | PRN

## 2024-05-07 MED ORDER — ACETAMINOPHEN 325 MG PO TABS: 650.0000 mg | ORAL_TABLET | ORAL | Status: DC | PRN

## 2024-05-07 MED ORDER — PENICILLIN G POT IN DEXTROSE 60000 UNIT/ML IV SOLN
3.0000 10*6.[IU] | INTRAVENOUS | Status: DC
  Administered 2024-05-08: 3 10*6.[IU] via INTRAVENOUS
  Filled 2024-05-07: qty 50

## 2024-05-07 MED ORDER — OXYTOCIN BOLUS FROM INFUSION
333.0000 mL | Freq: Once | INTRAVENOUS | Status: AC
  Administered 2024-05-08: 333 mL via INTRAVENOUS

## 2024-05-07 MED ORDER — SODIUM CHLORIDE 0.9 % IV SOLN
5.0000 10*6.[IU] | Freq: Once | INTRAVENOUS | Status: AC
  Administered 2024-05-08: 5 10*6.[IU] via INTRAVENOUS
  Filled 2024-05-07: qty 5

## 2024-05-07 MED ORDER — ONDANSETRON HCL 4 MG/2ML IJ SOLN: 4.0000 mg | Freq: Four times a day (QID) | INTRAMUSCULAR | Status: DC | PRN

## 2024-05-07 MED ORDER — OXYCODONE-ACETAMINOPHEN 5-325 MG PO TABS: 2.0000 | ORAL_TABLET | ORAL | Status: DC | PRN

## 2024-05-07 MED ORDER — OXYTOCIN-SODIUM CHLORIDE 30-0.9 UT/500ML-% IV SOLN
2.5000 [IU]/h | INTRAVENOUS | Status: DC
  Filled 2024-05-07: qty 500

## 2024-05-07 MED ORDER — OXYCODONE-ACETAMINOPHEN 5-325 MG PO TABS: 1.0000 | ORAL_TABLET | ORAL | Status: DC | PRN

## 2024-05-07 MED ORDER — FENTANYL CITRATE (PF) 100 MCG/2ML IJ SOLN
50.0000 ug | INTRAMUSCULAR | Status: DC | PRN
  Administered 2024-05-08: 100 ug via INTRAVENOUS
  Filled 2024-05-07: qty 2

## 2024-05-07 MED ORDER — LACTATED RINGERS IV SOLN: 500.0000 mL | INTRAVENOUS | Status: DC | PRN

## 2024-05-07 MED ORDER — LIDOCAINE HCL (PF) 1 % IJ SOLN: 30.0000 mL | INTRAMUSCULAR | Status: DC | PRN

## 2024-05-07 MED ORDER — ACETAMINOPHEN 500 MG PO TABS: 1000.0000 mg | ORAL_TABLET | Freq: Once | ORAL | Status: DC

## 2024-05-07 NOTE — H&P (Signed)
 OB History & Physical   History of Present Illness:  Chief Complaint:   HPI:  Kaitlin Marquez is a 28 y.o. G2P1001 female at [redacted]w[redacted]d dated by LMP.  She presents to L&D for active labor  She reports:  -active fetal movement -no leakage of fluid  -no vaginal bleeding -onset of contractions this am currently every 3-4 minutes  Pregnancy Issues: Anemia - encouraged to continue to supplement History of macrosomia Hx depression Varicella Non-Immune: vaccinate postpartum GBS Positive at NOB   Maternal Medical History:   Past Medical History:  Diagnosis Date   Medical history non-contributory     Past Surgical History:  Procedure Laterality Date   NO PAST SURGERIES      No Known Allergies  Prior to Admission medications   Medication Sig Start Date End Date Taking? Authorizing Provider  ferrous sulfate  325 (65 FE) MG tablet Take 325 mg by mouth daily with breakfast.    [provider]  ibuprofen  (ADVIL ,MOTRIN ) 600 MG tablet Take 1 tablet (600 mg total) by mouth every 6 (six) hours. 01/21/16   Schermerhorn, Debby PARAS, MD  ondansetron  (ZOFRAN ) 4 MG tablet Take 1 tablet (4 mg total) by mouth every 6 (six) hours. 05/03/20   Corlis Burnard DEL, NP  Prenatal Vit-Fe Fumarate-FA (PRENATAL MULTIVITAMIN) TABS tablet Take 1 tablet by mouth daily at 12 noon.    [provider]  witch hazel-glycerin  (TUCKS) pad Apply 1 application topically as needed for hemorrhoids. 01/21/16   Schermerhorn, Debby PARAS, MD     Prenatal care site: [facility]   Social History: She  reports that she has never smoked. She does not have any smokeless tobacco history on file. She reports that she does not drink alcohol and does not use drugs.  Family History: family history is not on file.   Review of Systems: A full review of systems was performed and negative except as noted in the HPI.    Physical Exam:  Vital Signs: BP 130/69   Pulse 94   LMP 08/02/2023   General:   alert  and cooperative  Skin:  normal  Neurologic:    Alert & oriented x 3  Lungs:   Normal effort  Heart:   regular rate and rhythm  Abdomen:  soft, non-tender; bowel sounds normal; no masses,  no organomegaly  Extremities: : non-tender, symmetric, no edema bilaterally.      EFW: 04/10/24 2589g (5lbs11oz) =27%   No results found for this or any previous visit (from the past 24 hours).  Pertinent Results:  Prenatal Labs: Blood type/Rh O pos  Antibody screen neg  Rubella Immune  Varicella Non-Immune  RPR NR  HBsAg Neg  HIV NR  GC neg  Chlamydia neg  Genetic screening Declined  1 hour GTT 91  3 hour GTT   GBS Pos   FHT: FHR: 125 bpm, variability: moderate,  accelerations:  Present,  decelerations:  Absent Category/reactivity:  Category I TOCO: regular, every 3-4 minutes SVE: Dilation: 5 / Effacement (%): 90 / Station: -2     Assessment:  Kaitlin Marquez is a 28 y.o. G2P1001 female at [redacted]w[redacted]d with active labor.   Plan:  1. Admit to Labor & Delivery; consents reviewed and obtained  2. Fetal Well being  - Fetal Tracing: Cat I - GBS pos - Presentation: vtx confirmed by sve   3. Routine OB: - Prenatal labs reviewed, as above - Rh pos - CBC & T&S on admit - Clear fluids, IVF  4. Monitoring of Labor -  Contractions by external toco in place -  Pelvis proven to 8-9lbs -  Plan for continuous fetal monitoring  -  Maternal pain control as desired: IVPM, nitrous, regional anesthesia - Anticipate vaginal delivery  5. Post Partum Planning: - Infant feeding: breastfeeding - Contraception: POPs - Needs varicella vaccine  - Tdap: given 5/20/2  - Flu: declined - RSV: not in season  Valeta Paz [place], CNM 05/07/2024 11:57 PM

## 2024-05-07 NOTE — Discharge Summary (Signed)
 Patient ID: Kaitlin Marquez MRN: [id number] DOB/AGE: 26-Jul-1996 28 y.o.  Admit date: 05/07/2024 Discharge date: 05/07/2024  Admission Diagnoses: Uterine contractions  Discharge Diagnoses: Early vs latent labor  Factors complicating pregnancy: Anemia - encouraged to continue to supplement History of macrosomia Hx depression Varicella Non-Immune: vaccinate postpartum GBS Positive at NOB   Prenatal Procedures: NST  Consults: None  Significant Diagnostic Studies:  No results found for this or any previous visit (from the past week).  Treatments: none  Hospital Course:  This is a 28 y.o. G2P1001 with IUP at [redacted]w[redacted]d seen for uterine contractions, noted to have a cervical exam of 1.5/20/-3.  No leaking of fluid and no bleeding.  She was observed, fetal heart rate monitoring remained reassuring, and she had no signs/symptoms of progressing labor or other maternal-fetal concerns.  Pt requested to go home.  She was deemed stable for discharge to home with outpatient follow up.  Discharge Physical Exam:  BP 113/68 (BP Location: Left Arm)   Pulse 93   Resp 19   Wt 67.9 kg   LMP 08/02/2023   SpO2 98%   BMI 29.22 kg/m   NST: FHR baseline: 125 bpm Variability: moderate Accelerations: yes Decelerations: none Category/reactivity: reactive   TOCO: 3-8 min SVE:  Dilation: 1.5 Effacement (%): 20 Cervical Position: Posterior Station: -3 Presentation: Vertex Exam by:: LOIS Rich, RN   Discharge Condition: Stable  Disposition: Discharge disposition: 01-Home or Self Care        Allergies as of 05/07/2024   No Known Allergies      Medication List     TAKE these medications    ferrous sulfate  325 (65 FE) MG tablet Take 325 mg by mouth daily with breakfast.   ibuprofen  600 MG tablet Commonly known as: ADVIL  Take 1 tablet (600 mg total) by mouth every 6 (six) hours.   ondansetron  4 MG tablet Commonly known as: ZOFRAN  Take 1 tablet (4 mg total) by mouth  every 6 (six) hours.   prenatal multivitamin Tabs tablet Take 1 tablet by mouth daily at 12 noon.   witch hazel-glycerin  pad Commonly known as: TUCKS Apply 1 application topically as needed for hemorrhoids.        Follow-up Information     [facility] Follow up.   Why: Keep all scheduled appointments Contact information: [address] [place]  [address] [phone]                Signed:  DELON COE, CNM 05/07/2024 8:14 AM

## 2024-05-07 NOTE — H&P (Incomplete)
 OB History & Physical   History of Present Illness:  Chief Complaint:   HPI:  Kaitlin Marquez is a 28 y.o. G2P1001 female at [redacted]w[redacted]d dated by LMP.  She presents to L&D for active labor  She reports:  -active fetal movement -no leakage of fluid  -no vaginal bleeding -onset of contractions this am currently every 3-4 minutes  Pregnancy Issues: Anemia - encouraged to continue to supplement History of macrosomia Hx depression Varicella Non-Immune: vaccinate postpartum GBS Positive at NOB   Maternal Medical History:   Past Medical History:  Diagnosis Date  . Medical history non-contributory     Past Surgical History:  Procedure Laterality Date  . NO PAST SURGERIES      No Known Allergies  Prior to Admission medications   Medication Sig Start Date End Date Taking? Authorizing Provider  ferrous sulfate  325 (65 FE) MG tablet Take 325 mg by mouth daily with breakfast.    [provider]  ibuprofen  (ADVIL ,MOTRIN ) 600 MG tablet Take 1 tablet (600 mg total) by mouth every 6 (six) hours. 01/21/16   Schermerhorn, Debby PARAS, MD  ondansetron  (ZOFRAN ) 4 MG tablet Take 1 tablet (4 mg total) by mouth every 6 (six) hours. 05/03/20   Corlis Burnard DEL, NP  Prenatal Vit-Fe Fumarate-FA (PRENATAL MULTIVITAMIN) TABS tablet Take 1 tablet by mouth daily at 12 noon.    [provider]  witch hazel-glycerin  (TUCKS) pad Apply 1 application topically as needed for hemorrhoids. 01/21/16   Schermerhorn, Debby PARAS, MD     Prenatal care site: [facility]   Social History: She  reports that she has never smoked. She does not have any smokeless tobacco history on file. She reports that she does not drink alcohol and does not use drugs.  Family History: family history is not on file.   Review of Systems: A full review of systems was performed and negative except as noted in the HPI.    Physical Exam:  Vital Signs: BP 130/69   Pulse 94   LMP 08/02/2023   General:   alert  and cooperative  Skin:  normal  Neurologic:    Alert & oriented x 3  Lungs:   Normal effort  Heart:   regular rate and rhythm  Abdomen:  soft, non-tender; bowel sounds normal; no masses,  no organomegaly  Extremities: : non-tender, symmetric, no edema bilaterally.      EFW: 04/10/24 2589g (5lbs11oz) =27%   No results found for this or any previous visit (from the past 24 hours).  Pertinent Results:  Prenatal Labs: Blood type/Rh O pos  Antibody screen neg  Rubella Immune  Varicella Non-Immune  RPR NR  HBsAg Neg  HIV NR  GC neg  Chlamydia neg  Genetic screening Declined  1 hour GTT 91  3 hour GTT   GBS Pos   FHT: {findings; monitor fetal heart monitor:21620:::0} Category/reactivity:  {CHL FWB PLAN:21624:::0} TOCO: {obgyn contractions reg/irreg:312982:::0} SVE: Dilation: 5 / Effacement (%): 90 / Station: -2     Assessment:  Kaitlin Marquez is a 28 y.o. G2P1001 female at [redacted]w[redacted]d with active labor.   Plan:  1. Admit to Labor & Delivery; consents reviewed and obtained  2. Fetal Well being  - Fetal Tracing: Cat I - GBS pos - Presentation: vtx confirmed by sve   3. Routine OB: - Prenatal labs reviewed, as above - Rh pos - CBC & T&S on admit - Clear fluids, IVF  4. Monitoring of Labor -  Contractions  by external toco in place -  Pelvis proven to 8-9lbs -  Plan for continuous fetal monitoring  -  Maternal pain control as desired: IVPM, nitrous, regional anesthesia - Anticipate vaginal delivery  5. Post Partum Planning: - Infant feeding: breastfeeding - Contraception: POPs - Needs varicella vaccine  - Tdap: given 5/20/2  - Flu: declined - RSV: not in season  Staceyann Knouff [place], CNM 05/07/2024 11:57 PM

## 2024-05-07 NOTE — OB Triage Note (Signed)
 Patient discharged by CNM. Discharge teachings, return precautions, stages of labor, and pain management discussed, patient verbalized understanding to all. VSS, FHT's 130. Patient ambulated off unit accompanied by significant other and child, denies questions and concerns at this time.

## 2024-05-07 NOTE — OB Triage Note (Signed)
 Pt is G2P1 at 39.6 weeks with c/o UCs and most specifically back pain.

## 2024-05-08 ENCOUNTER — Encounter: Payer: Self-pay | Admitting: Obstetrics and Gynecology

## 2024-05-08 LAB — CBC
HCT: 35.5 % — ABNORMAL LOW (ref 36.0–46.0)
Hemoglobin: 12 g/dL (ref 12.0–15.0)
MCH: 29.1 pg (ref 26.0–34.0)
MCHC: 33.8 g/dL (ref 30.0–36.0)
MCV: 86.2 fL (ref 80.0–100.0)
Platelets: 226 K/uL (ref 150–400)
RBC: 4.12 MIL/uL (ref 3.87–5.11)
RDW: 14.3 % (ref 11.5–15.5)
WBC: 9.7 K/uL (ref 4.0–10.5)
nRBC: 0 % (ref 0.0–0.2)

## 2024-05-08 LAB — RPR: RPR Ser Ql: NONREACTIVE

## 2024-05-08 LAB — TYPE AND SCREEN
ABO/RH(D): O POS
Antibody Screen: NEGATIVE

## 2024-05-08 MED ORDER — PRENATAL MULTIVITAMIN CH
1.0000 | ORAL_TABLET | Freq: Every day | ORAL | Status: DC
  Administered 2024-05-08 – 2024-05-09 (×2): 1 via ORAL
  Filled 2024-05-08 (×3): qty 1

## 2024-05-08 MED ORDER — ONDANSETRON HCL 4 MG PO TABS: 4.0000 mg | ORAL_TABLET | ORAL | Status: DC | PRN

## 2024-05-08 MED ORDER — DIBUCAINE (PERIANAL) 1 % EX OINT: 1.0000 | TOPICAL_OINTMENT | CUTANEOUS | Status: DC | PRN

## 2024-05-08 MED ORDER — SIMETHICONE 80 MG PO CHEW: 80.0000 mg | CHEWABLE_TABLET | ORAL | Status: DC | PRN

## 2024-05-08 MED ORDER — OXYCODONE HCL 5 MG PO TABS: 10.0000 mg | ORAL_TABLET | ORAL | Status: DC | PRN

## 2024-05-08 MED ORDER — OXYCODONE HCL 5 MG PO TABS: 5.0000 mg | ORAL_TABLET | ORAL | Status: DC | PRN

## 2024-05-08 MED ORDER — DIPHENHYDRAMINE HCL 25 MG PO CAPS: 25.0000 mg | ORAL_CAPSULE | Freq: Four times a day (QID) | ORAL | Status: DC | PRN

## 2024-05-08 MED ORDER — TETANUS-DIPHTH-ACELL PERTUSSIS 5-2.5-18.5 LF-MCG/0.5 IM SUSY: 0.5000 mL | PREFILLED_SYRINGE | Freq: Once | INTRAMUSCULAR | Status: DC

## 2024-05-08 MED ORDER — ACETAMINOPHEN 325 MG PO TABS
650.0000 mg | ORAL_TABLET | ORAL | Status: DC | PRN
  Administered 2024-05-08: 650 mg via ORAL
  Filled 2024-05-08: qty 2

## 2024-05-08 MED ORDER — ONDANSETRON HCL 4 MG/2ML IJ SOLN: 4.0000 mg | INTRAMUSCULAR | Status: DC | PRN

## 2024-05-08 MED ORDER — WITCH HAZEL-GLYCERIN EX PADS: 1.0000 | MEDICATED_PAD | CUTANEOUS | Status: DC | PRN

## 2024-05-08 MED ORDER — BENZOCAINE-MENTHOL 20-0.5 % EX AERO
1.0000 | INHALATION_SPRAY | CUTANEOUS | Status: DC | PRN
  Administered 2024-05-08: 1 via TOPICAL
  Filled 2024-05-08: qty 56

## 2024-05-08 MED ORDER — COCONUT OIL OIL: 1.0000 | TOPICAL_OIL | Status: DC | PRN

## 2024-05-08 MED ORDER — FERROUS SULFATE 325 (65 FE) MG PO TABS
325.0000 mg | ORAL_TABLET | Freq: Two times a day (BID) | ORAL | Status: DC
  Administered 2024-05-08 – 2024-05-09 (×2): 325 mg via ORAL
  Filled 2024-05-08 (×2): qty 1

## 2024-05-08 MED ORDER — SENNOSIDES-DOCUSATE SODIUM 8.6-50 MG PO TABS
2.0000 | ORAL_TABLET | Freq: Every day | ORAL | Status: DC
  Administered 2024-05-09: 2 via ORAL
  Filled 2024-05-08: qty 2

## 2024-05-08 MED ORDER — IBUPROFEN 600 MG PO TABS
600.0000 mg | ORAL_TABLET | Freq: Four times a day (QID) | ORAL | Status: DC
  Administered 2024-05-08 (×3): 600 mg via ORAL
  Filled 2024-05-08 (×4): qty 1

## 2024-05-08 NOTE — Progress Notes (Signed)
 Labor Progress Note  Kaitlin Marquez is a 28 y.o. G2P1001 at [redacted]w[redacted]d by LMP admitted for active labor  Subjective: Pt is coping well with her contractions, Pt's husband is at her side and very supportive.  Objective: BP 117/72   Pulse 84   Temp 98.5 F (36.9 C) (Oral)   Resp 16   LMP 08/02/2023   Fetal Assessment: FHT:  FHR: 135 bpm, variability: moderate,  accelerations:  Present,  decelerations:  Absent Category/reactivity:  Category I UC:   regular, every 3 minutes SVE:    Dilation: 7cm  Effacement: 100%  Station:  -1  Consistency: soft  Position: anterior  Membrane status:AROM at 0424 Amniotic color: MSAF  Labs: Lab Results  Component Value Date   WBC 9.7 05/08/2024   HGB 12.0 05/08/2024   HCT 35.5 (L) 05/08/2024   MCV 86.2 05/08/2024   PLT 226 05/08/2024    Assessment / Plan: Spontaneous labor, progressing normally  Labor: Progressing normally Preeclampsia:  117/72 Fetal Wellbeing:  Category I Pain Control:  Labor support without medications I/D:  Afebrile, GBS pos - adequately treated, AROM Anticipated MOD:  NSVD  Margery FORBES Coe, CNM 05/08/2024, 4:28 AM

## 2024-05-08 NOTE — Discharge Summary (Signed)
 Postpartum Discharge Summary  Patient Name: Kaitlin Marquez DOB: [birth year] MRN: [id number]  Date of admission: 05/07/2024 Delivery date:05/08/2024 Delivering provider: MYRON NEST Date of discharge: 05/09/2024  Primary OB: [facility] OFE:Ejupzwu'd last menstrual period was 08/02/2023. EDC Estimated Date of Delivery: 05/08/24 Gestational Age at Delivery: [redacted]w[redacted]d   Admitting diagnosis: Uterine contractions [O47.9] Normal labor [O80, Z37.9] Intrauterine pregnancy: [redacted]w[redacted]d     Secondary diagnosis:   Principal Problem:   NSVD (normal spontaneous vaginal delivery) Active Problems:   Uterine contractions   Normal labor   Discharge Diagnosis: Term Pregnancy Delivered      Hospital course: Onset of Labor With Vaginal Delivery      28 y.o. yo G2P2002 at [redacted]w[redacted]d was admitted in Active Labor on 05/07/2024. Labor course was complicated by none. Membrane Rupture Time/Date: 4:24 AM,05/08/2024  Delivery Method:Vaginal, Spontaneous Operative Delivery:N/A Episiotomy: None Lacerations:  None Patient had a postpartum course without complication.  She is ambulating, tolerating a regular diet, passing flatus, and urinating well. Patient is discharged home in stable condition on 05/09/24.  Newborn Data: Birth date:05/08/2024 Birth time:5:16 AM Gender:Female Living status:Living Apgars:7 ,8  Weight:3210 g                                            Post partum procedures:none Augmentation:: AROM Complications: None Delivery Type: spontaneous vaginal delivery Anesthesia: non-pharmacological methods Placenta: spontaneous To Pathology: No   Prenatal Labs:  Blood type/Rh O pos  Antibody screen neg  Rubella Immune  Varicella Non-Immune  RPR NR  HBsAg Neg  HIV NR  GC neg  Chlamydia neg  Genetic screening Declined  1 hour GTT 91  3 hour GTT    GBS Pos    Magnesium  Sulfate received: No BMZ received: No Rhophylac:was not indicated MMR: was not indicated Varivax vaccine  given: was offered T-DaP:Given prenatally Flu: declined  Transfusion:No  Physical exam  Vitals:   05/08/24 1633 05/08/24 2018 05/08/24 2256 05/09/24 0724  BP: 100/60 (!) 99/48 116/63 (!) 111/59  Pulse: 72 64 60 (!) 51  Resp: 18 20 20 18   Temp: 98.9 F (37.2 C) 98.5 F (36.9 C) 98.3 F (36.8 C) 97.9 F (36.6 C)  TempSrc: Oral Oral Oral Oral  SpO2: 100% 100% 97% 100%  Weight:      Height:       General: alert, cooperative, and no distress Lochia: appropriate Uterine Fundus: firm Perineum: minimal edema/intact DVT Evaluation: No evidence of DVT seen on physical exam.  Labs: Lab Results  Component Value Date   WBC 8.2 05/09/2024   HGB 9.1 (L) 05/09/2024   HCT 27.4 (L) 05/09/2024   MCV 87.3 05/09/2024   PLT 193 05/09/2024      Latest Ref Rng & Units 06/14/2014    4:40 AM  CMP  Glucose 65 - 99 mg/dL 87   BUN 9 - 21 mg/dL 14   Creatinine 9.39 - 1.30 mg/dL 9.24   Sodium 867 - 858 mmol/L 139   Potassium 3.3 - 4.7 mmol/L 3.6   Chloride 97 - 107 mmol/L 108   CO2 16 - 25 mmol/L 25   Calcium  9.0 - 10.7 mg/dL 9.4    Edinburgh Score:    05/09/2024    8:31 AM  Edinburgh Postnatal Depression Scale Screening Tool  I have been able to laugh and see the funny side of things. 0  I have  looked forward with enjoyment to things. 0  I have blamed myself unnecessarily when things went wrong. 0  I have been anxious or worried for no good reason. 0  I have felt scared or panicky for no good reason. 0  Things have been getting on top of me. 0  I have been so unhappy that I have had difficulty sleeping. 0  I have felt sad or miserable. 0  I have been so unhappy that I have been crying. 0  The thought of harming myself has occurred to me. 0  Edinburgh Postnatal Depression Scale Total 0    Risk assessment for postpartum VTE and prophylactic treatment: Very high risk factors: None High risk factors: None Moderate risk factors: None  Postpartum VTE prophylaxis with LMWH not  indicated  After visit meds:  Allergies as of 05/09/2024   No Known Allergies      Medication List     STOP taking these medications    ondansetron  4 MG tablet Commonly known as: ZOFRAN        TAKE these medications    acetaminophen  325 MG tablet Commonly known as: Tylenol  Take 2 tablets (650 mg total) by mouth every 6 (six) hours as needed for mild pain (pain score 1-3) or fever (for pain scale < 4).   benzocaine -Menthol  20-0.5 % Aero Commonly known as: DERMOPLAST Apply 1 Application topically as needed for irritation (perineal discomfort).   ferrous sulfate  325 (65 FE) MG tablet Take 1 tablet (325 mg total) by mouth 2 (two) times daily with a meal. What changed: when to take this   ibuprofen  600 MG tablet Commonly known as: ADVIL  Take 1 tablet (600 mg total) by mouth every 6 (six) hours as needed for fever, mild pain (pain score 1-3) or cramping. What changed:  when to take this reasons to take this   prenatal multivitamin Tabs tablet Take 1 tablet by mouth daily at 12 noon.   senna-docusate 8.6-50 MG tablet Commonly known as: Senokot-S Take 2 tablets by mouth at bedtime as needed for mild constipation.   witch hazel-glycerin  pad Commonly known as: TUCKS Apply 1 Application topically as needed for hemorrhoids.       Discharge home in stable condition Infant Feeding: Bottle Infant Disposition:home with mother Discharge instruction: per After Visit Summary and Postpartum booklet. Activity: Advance as tolerated. Pelvic rest for 6 weeks.  Diet: routine diet Anticipated Birth Control: OCPs Postpartum Appointment:6 weeks Additional Postpartum F/U: Postpartum Depression checkup Future Appointments:No future appointments. Follow up Visit:  Follow-up Information     Myron Nest, CNM. Schedule an appointment as soon as possible for a visit in 6 week(s).   Specialty: Certified Nurse Midwife Contact information: [address] [place]  [address] [phone]         Myron Nest, CNM. Schedule an appointment as soon as possible for a visit in 1 week(s).   Specialty: Certified Nurse Midwife Why: for a mood check Contact information: [address] [place] [address] [phone]                 Plan:  Kaitlin Marquez was discharged to home in good condition. Follow-up appointment as directed.    Signed:  Edsel Charlies [place], CNM 05/09/2024 9:03 AM

## 2024-05-09 LAB — CBC
HCT: 27.4 % — ABNORMAL LOW (ref 36.0–46.0)
Hemoglobin: 9.1 g/dL — ABNORMAL LOW (ref 12.0–15.0)
MCH: 29 pg (ref 26.0–34.0)
MCHC: 33.2 g/dL (ref 30.0–36.0)
MCV: 87.3 fL (ref 80.0–100.0)
Platelets: 193 K/uL (ref 150–400)
RBC: 3.14 MIL/uL — ABNORMAL LOW (ref 3.87–5.11)
RDW: 14.7 % (ref 11.5–15.5)
WBC: 8.2 K/uL (ref 4.0–10.5)
nRBC: 0 % (ref 0.0–0.2)

## 2024-05-09 MED ORDER — FERROUS SULFATE 325 (65 FE) MG PO TABS: 325.0000 mg | ORAL_TABLET | Freq: Two times a day (BID) | ORAL | 3 refills | Status: AC

## 2024-05-09 MED ORDER — SENNOSIDES-DOCUSATE SODIUM 8.6-50 MG PO TABS: 2.0000 | ORAL_TABLET | Freq: Every evening | ORAL | 0 refills | Status: AC | PRN

## 2024-05-09 MED ORDER — IBUPROFEN 600 MG PO TABS: 600.0000 mg | ORAL_TABLET | Freq: Four times a day (QID) | ORAL | 0 refills | Status: AC | PRN

## 2024-05-09 MED ORDER — ACETAMINOPHEN 325 MG PO TABS: 650.0000 mg | ORAL_TABLET | Freq: Four times a day (QID) | ORAL | 0 refills | Status: AC | PRN

## 2024-05-09 MED ORDER — WITCH HAZEL-GLYCERIN EX PADS: 1.0000 | MEDICATED_PAD | CUTANEOUS | 0 refills | Status: AC | PRN

## 2024-05-09 MED ORDER — BENZOCAINE-MENTHOL 20-0.5 % EX AERO: 1.0000 | INHALATION_SPRAY | CUTANEOUS | 0 refills | Status: AC | PRN

## 2024-05-09 NOTE — Progress Notes (Signed)
 Patient discharged home with family. Discharge instructions, when to follow up, and prescriptions reviewed with patient. Patient verbalized understanding. Patient will be escorted out by auxiliary.
